# Patient Record
Sex: Male | Born: 1990 | Race: Black or African American | Hispanic: No | Marital: Single | Smoking: Never smoker
Health system: Southern US, Community
[De-identification: ages and names within clinical notes are randomized; demographics above are authoritative.]

## PROBLEM LIST (undated history)

## (undated) DIAGNOSIS — L813 Cafe au lait spots: Secondary | ICD-10-CM

## (undated) DIAGNOSIS — Q8789 Other specified congenital malformation syndromes, not elsewhere classified: Secondary | ICD-10-CM

## (undated) DIAGNOSIS — J45909 Unspecified asthma, uncomplicated: Secondary | ICD-10-CM

## (undated) HISTORY — PX: OTHER SURGICAL HISTORY: SHX169

---
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
## 2015-01-28 ENCOUNTER — Emergency Department
Admission: EM | Admit: 2015-01-28 | Discharge: 2015-01-28 | Disposition: A | Discharge status: Home / Self Care | Payer: Medicaid Other | Attending: Emergency Medicine | Admitting: Emergency Medicine

## 2015-01-28 ENCOUNTER — Encounter: Payer: Self-pay | Admitting: Emergency Medicine

## 2015-01-28 DIAGNOSIS — K047 Periapical abscess without sinus: Secondary | ICD-10-CM | POA: Insufficient documentation

## 2015-01-28 HISTORY — DX: Other specified congenital malformation syndromes, not elsewhere classified: Q87.89

## 2015-01-28 HISTORY — DX: Cafe au lait spots: L81.3

## 2015-01-28 MED ORDER — AMOXICILLIN 500 MG PO CAPS: 500.0000 mg | ORAL_CAPSULE | Freq: Three times a day (TID) | ORAL | Status: DC

## 2015-01-28 MED ORDER — IBUPROFEN 800 MG PO TABS: 800.0000 mg | ORAL_TABLET | Freq: Three times a day (TID) | ORAL | Status: DC

## 2015-01-28 NOTE — ED Notes (Signed)
Pt arrived by POV with noticeable swelling to left lower jaw/mouth that the morning of 01/27/2015 and has further progressed. Pt thinks it may be an abscess.

## 2015-01-28 NOTE — Discharge Instructions (Signed)
Dental Abscess A dental abscess is a collection of infected fluid (pus) from a bacterial infection in the inner part of the tooth (pulp). It usually occurs at the end of the tooth's root.  CAUSES   Severe tooth decay.  Trauma to the tooth that allows bacteria to enter into the pulp, such as a broken or chipped tooth. SYMPTOMS   Severe pain in and around the infected tooth.  Swelling and redness around the abscessed tooth or in the mouth or face.  Tenderness.  Pus drainage.  Bad breath.  Bitter taste in the mouth.  Difficulty swallowing.  Difficulty opening the mouth.  Nausea.  Vomiting.  Chills.  Swollen neck glands. DIAGNOSIS   A medical and dental history will be taken.  An examination will be performed by tapping on the abscessed tooth.  X-rays may be taken of the tooth to identify the abscess. TREATMENT The goal of treatment is to eliminate the infection. You may be prescribed antibiotic medicine to stop the infection from spreading. A root canal may be performed to save the tooth. If the tooth cannot be saved, it may be pulled (extracted) and the abscess may be drained.  HOME CARE INSTRUCTIONS  Only take over-the-counter or prescription medicines for pain, fever, or discomfort as directed by your caregiver.  Rinse your mouth (gargle) often with salt water ( tsp salt in 8 oz [250 ml] of warm water) to relieve pain or swelling.  Do not drive after taking pain medicine (narcotics).  Do not apply heat to the outside of your face.  Return to your dentist for further treatment as directed. SEEK MEDICAL CARE IF:  Your pain is not helped by medicine.  Your pain is getting worse instead of better. SEEK IMMEDIATE MEDICAL CARE IF:  You have a fever or persistent symptoms for more than 2-3 days.  You have a fever and your symptoms suddenly get worse.  You have chills or a very bad headache.  You have problems breathing or swallowing.  You have trouble  opening your mouth.  You have swelling in the neck or around the eye. Document Released: 07/28/2005 Document Revised: 04/21/2012 Document Reviewed: 11/05/2010 [facility] Patient Information 2015 [place], [place]. This information is not intended to replace advice given to you by your health care provider. Make sure you discuss any questions you have with your health care provider.   TAKE ALL OF THE MEDICATION PRESCRIBED TODAY CALL A DENTIST SUCH AS DR. SLOTT OR ONE THAT IS ACCEPTING MEDICAID PATIENTS

## 2015-01-28 NOTE — ED Provider Notes (Signed)
[facility] Emergency Department Provider Note  ____________________________________________  Time seen:  11:50 AM  I have reviewed the triage vital signs and the nursing notes.   HISTORY  Chief Complaint Dental Pain   HPI Scott Pearson is a 24 y.o. male here today with complaint of left lower jaw pain and swelling. He states he woke up this morning and noticed the swelling. He is not have a regular dentist and has not seen one in a long time. He states his mother gave him an aspirin for the pain. Currently he denies pain about 5. Eating has made this worse. Nothing is making it better   Past Medical History  Diagnosis Date  . Neurofibromatosis type 1-like syndrome     There are no active problems to display for this patient.   Past Surgical History  Procedure Laterality Date  . Neurofibromatosis removal      Current Outpatient Rx  Name  Route  Sig  Dispense  Refill  . amoxicillin (AMOXIL) 500 MG capsule   Oral   Take 1 capsule (500 mg total) by mouth 3 (three) times daily.   30 capsule   0   . ibuprofen (ADVIL,MOTRIN) 800 MG tablet   Oral   Take 1 tablet (800 mg total) by mouth 3 (three) times daily.   30 tablet   0     Allergies Peanuts  No family history on file.  Social History History  Substance Use Topics  . Smoking status: Never Smoker   . Smokeless tobacco: Not on file  . Alcohol Use: Yes    Review of Systems Constitutional: No fever/chills Eyes: No visual changes. ENT: No sore throat. Cardiovascular: Denies chest pain. Respiratory: Denies shortness of breath. Gastrointestinal: No abdominal pain.  No nausea, no vomiting. Musculoskeletal: Negative for back pain. Skin: Negative for rash. Neurological: Negative for headaches, focal weakness or numbness.  10-point ROS otherwise negative.  ____________________________________________   PHYSICAL EXAM:  VITAL SIGNS: ED Triage Vitals  Enc Vitals Group     BP  --      Pulse --      Resp --      Temp --      Temp src --      SpO2 --      Weight --      Height --      Head Cir --      Peak Flow --      Pain Score --      Pain Loc --      Pain Edu? --      Excl. in GC? --     Constitutional: Alert and oriented. Well appearing and in no acute distress. Eyes: Conjunctivae are normal. PERRL. EOMI. Head: Atraumatic. Nose: No congestion/rhinnorhea. Mouth/Throat: Mucous membranes are moist.  Oropharynx non-erythematous. Left lower gum midline lateral aspect with swelling. There is some mild facial edema in this area as well. Tenderness on palpation of the gum area. Neck: No stridor.  Supple Hematological/Lymphatic/Immunilogical: No cervical lymphadenopathy. Cardiovascular: Normal rate, regular rhythm. Grossly normal heart sounds.  Good peripheral circulation. Respiratory: Normal respiratory effort.  No retractions. Lungs CTAB. Gastrointestinal: Soft and nontender. No distention. Musculoskeletal: No lower extremity tenderness nor edema.  No joint effusions. Neurologic:  Normal speech and language. No gross focal neurologic deficits are appreciated. Speech is normal. No gait instability. Skin:  Skin is warm, dry and intact. No rash noted. As stated above Psychiatric: Mood and affect are normal. Speech  and behavior are normal.  ____________________________________________   LABS (all labs ordered are listed, but only abnormal results are displayed)  Labs Reviewed - No data to display   PROCEDURES  Procedure(s) performed: None  Critical Care performed: No  ____________________________________________   INITIAL IMPRESSION / ASSESSMENT AND PLAN / ED COURSE  Pertinent labs & imaging results that were available during my care of the patient were reviewed by me and considered in my medical decision making (see chart for details).  Patient was given a list of dental clinics that he may qualify for. He is also given a prescription for  amoxicillin and ibuprofen. ____________________________________________   FINAL CLINICAL IMPRESSION(S) / ED DIAGNOSES  Final diagnoses:  Dental abscess      Tommi Rumps, PA-C 01/28/15 [id number]  Minna Antis, MD 01/28/15 1506

## 2016-02-05 ENCOUNTER — Emergency Department: Payer: Self-pay

## 2016-02-05 ENCOUNTER — Emergency Department
Admission: EM | Admit: 2016-02-05 | Discharge: 2016-02-05 | Disposition: A | Discharge status: Home / Self Care | Payer: Self-pay | Attending: Emergency Medicine | Admitting: Emergency Medicine

## 2016-02-05 ENCOUNTER — Encounter: Payer: Self-pay | Admitting: Emergency Medicine

## 2016-02-05 DIAGNOSIS — M79671 Pain in right foot: Secondary | ICD-10-CM | POA: Insufficient documentation

## 2016-02-05 DIAGNOSIS — Z91010 Allergy to peanuts: Secondary | ICD-10-CM | POA: Insufficient documentation

## 2016-02-05 DIAGNOSIS — F1290 Cannabis use, unspecified, uncomplicated: Secondary | ICD-10-CM | POA: Insufficient documentation

## 2016-02-05 MED ORDER — NAPROXEN 500 MG PO TABS: 500.0000 mg | ORAL_TABLET | Freq: Two times a day (BID) | ORAL | Status: DC

## 2016-02-05 NOTE — Discharge Instructions (Signed)
Musculoskeletal Pain Musculoskeletal pain is muscle and boney aches and pains. These pains can occur in any part of the body. Your caregiver may treat you without knowing the cause of the pain. They may treat you if blood or urine tests, X-rays, and other tests were normal.  CAUSES There is often not a definite cause or reason for these pains. These pains may be caused by a type of germ (virus). The discomfort may also come from overuse. Overuse includes working out too hard when your body is not fit. Boney aches also come from weather changes. Bone is sensitive to atmospheric pressure changes. HOME CARE INSTRUCTIONS   Ask when your test results will be ready. Make sure you get your test results.  Only take over-the-counter or prescription medicines for pain, discomfort, or fever as directed by your caregiver. If you were given medications for your condition, do not drive, operate machinery or power tools, or sign legal documents for 24 hours. Do not drink alcohol. Do not take sleeping pills or other medications that may interfere with treatment.  Continue all activities unless the activities cause more pain. When the pain lessens, slowly resume normal activities. Gradually increase the intensity and duration of the activities or exercise.  During periods of severe pain, bed rest may be helpful. Lay or sit in any position that is comfortable.  Putting ice on the injured area.  Put ice in a bag.  Place a towel between your skin and the bag.  Leave the ice on for 15 to 20 minutes, 3 to 4 times a day.  Follow up with your caregiver for continued problems and no reason can be found for the pain. If the pain becomes worse or does not go away, it may be necessary to repeat tests or do additional testing. Your caregiver may need to look further for a possible cause. SEEK IMMEDIATE MEDICAL CARE IF:  You have pain that is getting worse and is not relieved by medications.  You develop chest pain  that is associated with shortness or breath, sweating, feeling sick to your stomach (nauseous), or throw up (vomit).  Your pain becomes localized to the abdomen.  You develop any new symptoms that seem different or that concern you. MAKE SURE YOU:   Understand these instructions.  Will watch your condition.  Will get help right away if you are not doing well or get worse.   This information is not intended to replace advice given to you by your health care provider. Make sure you discuss any questions you have with your health care provider.   Document Released: 07/28/2005 Document Revised: 10/20/2011 Document Reviewed: 04/01/2013 Elsevier Interactive Patient Education 2016 [place] therapy can help ease sore, stiff, injured, and tight muscles and joints. Heat relaxes your muscles, which may help ease your pain. Heat therapy should only be used on old, pre-existing, or long-lasting (chronic) injuries. Do not use heat therapy unless told by your doctor. HOW TO USE HEAT THERAPY There are several different kinds of heat therapy, including:  Moist heat pack.  Warm water bath.  Hot water bottle.  Electric heating pad.  Heated gel pack.  Heated wrap.  Electric heating pad. GENERAL HEAT THERAPY RECOMMENDATIONS   Do not sleep while using heat therapy. Only use heat therapy while you are awake.  Your skin may turn pink while using heat therapy. Do not use heat therapy if your skin turns red.  Do not use heat therapy if  you have new pain.  High heat or long exposure to heat can cause burns. Be careful when using heat therapy to avoid burning your skin.  Do not use heat therapy on areas of your skin that are already irritated, such as with a rash or sunburn. GET HELP IF:   You have blisters, redness, swelling (puffiness), or numbness.  You have new pain.  Your pain is worse. MAKE SURE YOU:  Understand these instructions.  Will watch your  condition.  Will get help right away if you are not doing well or get worse.   This information is not intended to replace advice given to you by your health care provider. Make sure you discuss any questions you have with your health care provider.   Document Released: 10/20/2011 Document Revised: 08/18/2014 Document Reviewed: 09/20/2013 Elsevier Interactive Patient Education [employer] Inc[employer]

## 2016-02-05 NOTE — ED Notes (Signed)
Developed pain with some swelling to right foot  Denies any injury but does a lot of standing

## 2016-02-05 NOTE — ED Provider Notes (Signed)
[facility] Emergency Department Provider Note  ____________________________________________  Time seen: Approximately 10:23 AM  I have reviewed the triage vital signs and the nursing notes.   HISTORY  Chief Complaint Foot Pain    HPI Scott Pearson is a 25 y.o. male who presents for evaluation of pain and swelling to his right foot. Denies loss of use. States he stands on his feet all day. Pain is increased when walking or standing. He reports his pain is 8/10.   Past Medical History  Diagnosis Date  . Neurofibromatosis type 1-like syndrome     There are no active problems to display for this patient.   Past Surgical History  Procedure Laterality Date  . Neurofibromatosis removal      Current Outpatient Rx  Name  Route  Sig  Dispense  Refill  . naproxen (NAPROSYN) 500 MG tablet   Oral   Take 1 tablet (500 mg total) by mouth 2 (two) times daily with a meal.   60 tablet   0     Allergies Peanuts  No family history on file.  Social History Social History  Substance Use Topics  . Smoking status: Never Smoker   . Smokeless tobacco: None  . Alcohol Use: Yes    Review of Systems Constitutional: No fever/chills Cardiovascular: Denies chest pain. Respiratory: Denies shortness of breath. Genitourinary: Negative for dysuria. Musculoskeletal: Positive for right foot pain Skin: Negative for rash. Neurological: Negative for headaches, focal weakness or numbness.  10-point ROS otherwise negative.  ____________________________________________   PHYSICAL EXAM:  VITAL SIGNS: ED Triage Vitals  Enc Vitals Group     BP 02/05/16 1021 116/63 mmHg     Pulse Rate 02/05/16 1021 72     Resp 02/05/16 1021 16     Temp 02/05/16 1021 98.2 F (36.8 C)     Temp Source 02/05/16 1021 Oral     SpO2 02/05/16 1021 99 %     Weight 02/05/16 1021 160 lb (72.576 kg)     Height 02/05/16 1021 5\' 10"  (1.778 m)     Head Cir --      Peak Flow --    Pain Score --      Pain Loc --      Pain Edu? --      Excl. in GC? --     Constitutional: Alert and oriented. Well appearing and in no acute distress. Cardiovascular: Normal rate, regular rhythm. Grossly normal heart sounds.  Good peripheral circulation. Respiratory: Normal respiratory effort.  No retractions. Lungs CTAB. Musculoskeletal: No lower extremity tenderness nor edema.  No joint effusions. Neurologic:  Normal speech and language. No gross focal neurologic deficits are appreciated. No gait instability. Skin:  Skin is warm, dry and intact. No rash noted. Psychiatric: Mood and affect are normal. Speech and behavior are normal.  ____________________________________________   LABS (all labs ordered are listed, but only abnormal results are displayed)  Labs Reviewed - No data to display ____________________________________________  RADIOLOGY  No acute osseous findings. ____________________________________________   PROCEDURES  Procedure(s) performed: None  Critical Care performed: No  ____________________________________________   INITIAL IMPRESSION / ASSESSMENT AND PLAN / ED COURSE  Pertinent labs & imaging results that were available during my care of the patient were reviewed by me and considered in my medical decision making (see chart for details).  Nonspecific right foot pain and swelling. Rx given for ibuprofen 800 mg 3 times a day. Encourage use of inserts for his shoes. Work excuse  24 hours given. Patient follow-up PCP or return to ER with any worsening symptomology. Referral given to podiatry Dr. Ether GriffinsFowler. ____________________________________________   FINAL CLINICAL IMPRESSION(S) / ED DIAGNOSES  Final diagnoses:  Foot pain, right     This chart was dictated using voice recognition software/Dragon. Despite best efforts to proofread, errors can occur which can change the meaning. Any change was purely unintentional.   Evangeline Dakinharles M Nahdia Doucet, PA-C 02/05/16  1126  Emily FilbertJonathan E Williams, MD 02/05/16 [id number]

## 2017-10-29 ENCOUNTER — Encounter: Payer: Self-pay | Admitting: Emergency Medicine

## 2017-10-29 ENCOUNTER — Emergency Department
Admission: EM | Admit: 2017-10-29 | Discharge: 2017-10-29 | Disposition: A | Discharge status: Home / Self Care | Payer: Self-pay | Attending: Emergency Medicine | Admitting: Emergency Medicine

## 2017-10-29 ENCOUNTER — Other Ambulatory Visit: Payer: Self-pay

## 2017-10-29 DIAGNOSIS — T782XXA Anaphylactic shock, unspecified, initial encounter: Secondary | ICD-10-CM | POA: Insufficient documentation

## 2017-10-29 DIAGNOSIS — Z91010 Allergy to peanuts: Secondary | ICD-10-CM | POA: Insufficient documentation

## 2017-10-29 DIAGNOSIS — T783XXA Angioneurotic edema, initial encounter: Secondary | ICD-10-CM | POA: Insufficient documentation

## 2017-10-29 MED ORDER — PREDNISONE 10 MG PO TABS: ORAL_TABLET | ORAL | 0 refills | Status: DC

## 2017-10-29 MED ORDER — PREDNISONE 20 MG PO TABS
40.0000 mg | ORAL_TABLET | Freq: Once | ORAL | Status: AC
  Administered 2017-10-29: 40 mg via ORAL
  Filled 2017-10-29: qty 2

## 2017-10-29 MED ORDER — EPINEPHRINE 0.3 MG/0.3ML IJ SOAJ: 0.3000 mg | Freq: Once | INTRAMUSCULAR | 2 refills | Status: AC

## 2017-10-29 MED ORDER — EPINEPHRINE 0.3 MG/0.3ML IJ SOAJ
0.3000 mg | Freq: Once | INTRAMUSCULAR | Status: AC
  Administered 2017-10-29: 0.3 mg via INTRAMUSCULAR

## 2017-10-29 MED ORDER — EPINEPHRINE 0.3 MG/0.3ML IJ SOAJ
INTRAMUSCULAR | Status: AC
  Administered 2017-10-29: 0.3 mg via INTRAMUSCULAR
  Filled 2017-10-29: qty 0.3

## 2017-10-29 NOTE — ED Notes (Signed)
Nurse introduced herself to pt. Pt stating swelling has gone down after Epi-pen. Pt in NAD. Left side of tongue remains with mild edema.

## 2017-10-29 NOTE — ED Notes (Signed)
Pt reports waking up with swelling to left half of tongue, denies taking any new medications or coming in contact with any new substance.  Pt does report allergy to peanuts, eating cashews last night, reports having cashews in past without any reaction.  Pt able to speak in full, clear sentences, no respiratory distress noted at this time.

## 2017-10-29 NOTE — ED Triage Notes (Signed)
Pt took Benadryl about 30 min ago, states he can't tell if it's working yet.

## 2017-10-29 NOTE — ED Triage Notes (Signed)
Pt woke up with swollen tongue this am, denies known trigger, denies shob, appears in no distress at this time.

## 2017-10-29 NOTE — Discharge Instructions (Signed)
You are evaluated for swelling of the tongue and also a vomiting episode, as we discussed, it is unclear whether not this could have been a severe allergic reaction called anaphylaxis for which she were go ahead and treated with the epinephrine autoinjector here and improved some.  Is also possible this could be what is called angioedema or hereditary angioedema.  In any case you do need to fill an epi autoinjector so that you have in case you have any allergic reaction including trouble swallowing or trouble breathing or known allergic reaction with nausea vomiting dizziness or passing out.  You also need to follow-up with a doctor for allergy testing and I have given the office number for ENT, as well as a primary care doctor.  Return to emergency department immediately for any worsening swelling, certainly any trouble swallowing or trouble breathing, passing out, or any other symptoms concerning to you.  You may take over-the-counter Benadryl 25 mg every 4 hours as needed for any additional ongoing swelling.

## 2017-10-29 NOTE — ED Provider Notes (Signed)
[facility] Emergency Department Provider Note ____________________________________________   I have reviewed the triage vital signs and the triage nursing note.  HISTORY  Chief Complaint No chief complaint on file. Tongue swelling  Historian Patient  HPI NIV Scott Pearson is a 27 y.o. male with a history of peanut allergy although is not carry an EpiPen, presented to the ER due to tongue swelling.  States he went to bed around 4 AM this morning and then woke up just prior to arrival with left side of his tongue swollen.  No trouble breathing or trouble swallowing.  He did have an episode of throwing up this morning.  He is not having ongoing nausea.  No diarrhea.  No dizziness passing out.  No skin itching or rash.  No known peanut exposure, although it sounds like there was peanut butter or something in the household from somebody else's use.  He did have cashews, but he has no history of a tree nut allergy.  Symptoms moderate.  No known family history of angioedema.  Not on any medications.   Past Medical History:  Diagnosis Date  . Neurofibromatosis type 1-like syndrome     There are no active problems to display for this patient.   Past Surgical History:  Procedure Laterality Date  . neurofibromatosis removal      Prior to Admission medications   Medication Sig Start Date End Date Taking? Authorizing Provider  EPINEPHrine (EPIPEN 2-PAK) 0.3 mg/0.3 mL IJ SOAJ injection Inject 0.3 mLs (0.3 mg total) into the muscle once for 1 dose. 10/29/17 10/29/17  Governor Rooks, MD  naproxen (NAPROSYN) 500 MG tablet Take 1 tablet (500 mg total) by mouth 2 (two) times daily with a meal. Patient not taking: Reported on 10/29/2017 02/05/16   Beers, Charmayne Sheer, PA-C  predniSONE (DELTASONE) 10 MG tablet 40mg  daily for 2 days 30mg  daily for 2 days 20mg  daily for 2 days 10 mg daily for 2 days 10/29/17   Governor Rooks, MD    Allergies  Allergen Reactions  . Peanuts  [Peanut Oil] Anaphylaxis    No family history on file.  Social History Social History   Tobacco Use  . Smoking status: Never Smoker  . Smokeless tobacco: Never Used  Substance Use Topics  . Alcohol use: Yes  . Drug use: Yes    Types: Marijuana    Review of Systems  Constitutional: Negative for fever. Eyes: Negative for visual changes. ENT: Negative for sore throat.  Left-sided tongue swelling as per HPI. Cardiovascular: Negative for chest pain. Respiratory: Negative for shortness of breath. Gastrointestinal: Negative for abdominal pain, diarrhea. Genitourinary: Negative for dysuria. Musculoskeletal: Negative for back pain. Skin: Negative for rash. Neurological: Negative for headache.  ____________________________________________   PHYSICAL EXAM:  VITAL SIGNS: ED Triage Vitals [10/29/17 0914]  Enc Vitals Group     BP 118/72     Pulse Rate 72     Resp 18     Temp 97.7 F (36.5 C)     Temp Source Oral     SpO2 100 %     Weight 150 lb (68 kg)     Height 5\' 10"  (1.778 m)     Head Circumference      Peak Flow      Pain Score 0     Pain Loc      Pain Edu?      Excl. in GC?      Constitutional: Alert and oriented. Well appearing and in no  distress. HEENT   Head: Normocephalic and atraumatic.      Eyes: Conjunctivae are normal. Pupils equal and round.       Ears:         Nose: No congestion/rhinnorhea.   Mouth/Throat: Mucous membranes are moist.  Left side of his tongue is significant swelling.  Lips are normal.  No posterior pharyngeal erythema.  No trouble speaking or hoarse voice.   Neck: No stridor. Cardiovascular/Chest: Normal rate, regular rhythm.  No murmurs, rubs, or gallops. Respiratory: Normal respiratory effort without tachypnea nor retractions. Breath sounds are clear and equal bilaterally. No wheezes/rales/rhonchi. Gastrointestinal: Soft. No distention, no guarding, no rebound. Nontender.     Genitourinary/rectal:Deferred Musculoskeletal: Nontender with normal range of motion in all extremities. No joint effusions.  No lower extremity tenderness.  No edema. Neurologic:  Normal speech and language. No gross or focal neurologic deficits are appreciated. Skin:  Skin is warm, dry and intact. No rash noted. Psychiatric: Mood and affect are normal. Speech and behavior are normal. Patient exhibits appropriate insight and judgment.   ____________________________________________  LABS (pertinent positives/negatives) I, Governor Rooksebecca Scout Guyett, MD the attending physician have reviewed the labs noted below.  Labs Reviewed - No data to display  __________________________________________  PROCEDURES  Procedure(s) performed: None  Procedures  Critical Care performed: None   ____________________________________________  ED COURSE / ASSESSMENT AND PLAN  Pertinent labs & imaging results that were available during my care of the patient were reviewed by me and considered in my medical decision making (see chart for details).  She does have angioedema to the left side of his tongue but is not having any acute airway or swallowing issues.  He is not on any medications that should be causing this.  Of some concern is the fact that he does have at least one anaphylactic allergy but is not on EpiPen.  No known exposure.  Given the fact that he had vomiting this morning out of nowhere, I question the fact of whether or not this could have been anaphylaxis with swelling as well as vomiting and discussed with him to go ahead and treat with a dose of epi.  Patient was taught at the bedside how to use the EpiPen autoinjector.  He was also given Benadryl earlier.  After 4 hours observation tongue is almost back to normal.  We discussed the possibility of whether or not this could have been a isolated angioedema or hereditary angioedema versus anaphylaxis to either contamination with peanuts which he  knows he is allergic to whether not there is a new allergy.  We discussed the questionable benefit to steroid course but chose to proceed.  He is going to do over-the-counter Benadryl.  I have prescribed him epi autoinjector and discussed how to use it.  We discussed return precautions.      Patient / Family / Caregiver informed of clinical course, medical decision-making process, and agree with plan.   I discussed return precautions, follow-up instructions, and discharge instructions with patient and/or family.  Discharge Instructions : You are evaluated for swelling of the tongue and also a vomiting episode, as we discussed, it is unclear whether not this could have been a severe allergic reaction called anaphylaxis for which she were go ahead and treated with the epinephrine autoinjector here and improved some.  Is also possible this could be what is called angioedema or hereditary angioedema.  In any case you do need to fill an epi autoinjector so that you have in case you  have any allergic reaction including trouble swallowing or trouble breathing or known allergic reaction with nausea vomiting dizziness or passing out.  You also need to follow-up with a doctor for allergy testing and I have given the office number for ENT, as well as a primary care doctor.  Return to emergency department immediately for any worsening swelling, certainly any trouble swallowing or trouble breathing, passing out, or any other symptoms concerning to you.  You may take over-the-counter Benadryl 25 mg every 4 hours as needed for any additional ongoing swelling.    ___________________________________________   FINAL CLINICAL IMPRESSION(S) / ED DIAGNOSES   Final diagnoses:  Angioedema, initial encounter  Anaphylaxis, initial encounter      ___________________________________________        Note: This dictation was prepared with Dragon dictation. Any transcriptional errors that result  from this process are unintentional    Governor Rooks, MD 10/29/17 1348

## 2017-10-29 NOTE — ED Notes (Addendum)
First Nurse Note:  Patient has swelling to left side of tongue that started at 0400 this AM.  Vomited this AM also.  Speech is clear, speaking in full sentences.  Denies SHOB.  Wearing face mask.

## 2019-08-20 ENCOUNTER — Emergency Department: Payer: HRSA Program

## 2019-08-20 ENCOUNTER — Other Ambulatory Visit: Payer: Self-pay

## 2019-08-20 ENCOUNTER — Emergency Department
Admission: EM | Admit: 2019-08-20 | Discharge: 2019-08-20 | Disposition: A | Discharge status: Home / Self Care | Payer: HRSA Program | Attending: Student | Admitting: Student

## 2019-08-20 DIAGNOSIS — Z20822 Contact with and (suspected) exposure to covid-19: Secondary | ICD-10-CM

## 2019-08-20 DIAGNOSIS — J45909 Unspecified asthma, uncomplicated: Secondary | ICD-10-CM | POA: Diagnosis not present

## 2019-08-20 DIAGNOSIS — R0602 Shortness of breath: Secondary | ICD-10-CM

## 2019-08-20 DIAGNOSIS — U071 COVID-19: Secondary | ICD-10-CM | POA: Insufficient documentation

## 2019-08-20 HISTORY — DX: Unspecified asthma, uncomplicated: J45.909

## 2019-08-20 MED ORDER — ALBUTEROL SULFATE HFA 108 (90 BASE) MCG/ACT IN AERS: 2.0000 | INHALATION_SPRAY | Freq: Four times a day (QID) | RESPIRATORY_TRACT | 0 refills | Status: DC | PRN

## 2019-08-20 MED ORDER — PREDNISONE 10 MG PO TABS: 10.0000 mg | ORAL_TABLET | Freq: Every day | ORAL | 0 refills | Status: DC

## 2019-08-20 NOTE — Discharge Instructions (Signed)
Your Covid results can take 18 to 24 hours.  They will call you if your test are positive.  Please quarantine until your test have resulted.  Return to the ER for any worsening shortness of breath, chest pain, worsening symptoms or urgent changes in health.

## 2019-08-20 NOTE — ED Notes (Signed)
Per pt kids tested positive for COVID "a couple days ago" pt couldn't remember how many days exactly

## 2019-08-20 NOTE — ED Provider Notes (Signed)
[facility] EMERGENCY DEPARTMENT Provider Note   CSN: [id number] Arrival date & time: 08/20/19  1928     History Chief Complaint  Patient presents with  . Shortness of Breath    Scott Pearson is a 29 y.o. male presents to the emergency department for evaluation of shortness of breath.  Shortness of breath occurred an hour ago at the grocery store.  He describes hard time catching his breath.  States he has been exposed to Covid and feels as if he has Covid due to both of his sons being positive.  He denies being tested for Covid.  He has had no cough congestion runny nose, wheezing, chest pain, palpitations, dizziness, rashes, abdominal pain nausea vomiting or diarrhea.  His shortness of breath is not present at this time.  He denies any history of blood clots, DVTs or clotting disorders.  HPI     Past Medical History:  Diagnosis Date  . Asthma   . Neurofibromatosis type 1-like syndrome     There are no problems to display for this patient.   Past Surgical History:  Procedure Laterality Date  . neurofibromatosis removal         No family history on file.  Social History   Tobacco Use  . Smoking status: Never Smoker  . Smokeless tobacco: Never Used  Substance Use Topics  . Alcohol use: Yes    Comment: occasion  . Drug use: Yes    Types: Marijuana    Home Medications Prior to Admission medications   Medication Sig Start Date End Date Taking? Authorizing Provider  albuterol (VENTOLIN HFA) 108 (90 Base) MCG/ACT inhaler Inhale 2 puffs into the lungs every 6 (six) hours as needed for wheezing or shortness of breath. 08/20/19   Duanne Guess, PA-C  naproxen (NAPROSYN) 500 MG tablet Take 1 tablet (500 mg total) by mouth 2 (two) times daily with a meal. Patient not taking: Reported on 10/29/2017 02/05/16   Beers, Pierce Crane, PA-C  predniSONE (DELTASONE) 10 MG tablet Take 1 tablet (10 mg total) by mouth daily. 6,5,4,3,2,1 six day taper 08/20/19    Duanne Guess, PA-C    Allergies    Peanuts [peanut oil]  Review of Systems   Review of Systems  Constitutional: Negative for chills and fever.  Respiratory: Positive for shortness of breath. Negative for cough.   Cardiovascular: Negative for chest pain and leg swelling.  Gastrointestinal: Negative for diarrhea, nausea and vomiting.  Musculoskeletal: Negative for myalgias and neck pain.  Neurological: Negative for dizziness, light-headedness and headaches.    Physical Exam Updated Vital Signs BP 133/65 (BP Location: Left Arm)   Pulse 79   Temp 98.4 F (36.9 C) (Oral)   Resp 18   Ht 5\' 9"  (1.753 m)   Wt 74.8 kg   SpO2 99%   BMI 24.37 kg/m   Physical Exam Constitutional:      Appearance: He is well-developed.  HENT:     Head: Normocephalic and atraumatic.  Eyes:     Conjunctiva/sclera: Conjunctivae normal.  Cardiovascular:     Rate and Rhythm: Normal rate.  Pulmonary:     Effort: Pulmonary effort is normal. No tachypnea, accessory muscle usage or respiratory distress.     Breath sounds: Normal breath sounds. No stridor. No decreased breath sounds, wheezing, rhonchi or rales.  Chest:     Chest wall: No tenderness.  Musculoskeletal:        General: Normal range of motion.  Cervical back: Normal range of motion.     Comments: No swelling or edema in the lower extremities.  Skin:    General: Skin is warm.     Findings: No rash.  Neurological:     General: No focal deficit present.     Mental Status: He is alert. He is disoriented.  Psychiatric:        Behavior: Behavior normal.        Thought Content: Thought content normal.     ED Results / Procedures / Treatments   Labs (all labs ordered are listed, but only abnormal results are displayed) Labs Reviewed  SARS CORONAVIRUS 2 (TAT 6-24 HRS)    EKG None  Radiology DG Chest Portable 1 View  Result Date: 08/20/2019 CLINICAL DATA:  COVID shortness of breath EXAM: PORTABLE CHEST 1 VIEW COMPARISON:   None. FINDINGS: The heart size and mediastinal contours are within normal limits. Both lungs are clear. The visualized skeletal structures are unremarkable. IMPRESSION: No active disease. Electronically Signed   By: Jonna Clark M.D.   On: 08/20/2019 21:34    Procedures Procedures (including critical care time)  Medications Ordered in ED Medications - No data to display  ED Course  I have reviewed the triage vital signs and the nursing notes.  Pertinent labs & imaging results that were available during my care of the patient were reviewed by me and considered in my medical decision making (see chart for details).    MDM Rules/Calculators/A&P                      29 year old male with shortness of breath earlier today.  Shortness of breath has resolved.  No chest pain.  No history of blood clots, PE.  No clotting disorders.  Vital signs are stable with normal O2 sats.  Heart rate normal.  Chest x-ray negative.  He has been exposed to Covid, both of his children are positive.  I have put in for a Covid test and he understands signs and symptoms return to ED for.  He is given prednisone and albuterol. Final Clinical Impression(s) / ED Diagnoses Final diagnoses:  SOB (shortness of breath)  Close exposure to COVID-19 virus    Rx / DC Orders ED Discharge Orders         Ordered    predniSONE (DELTASONE) 10 MG tablet  Daily     08/20/19 2209    albuterol (VENTOLIN HFA) 108 (90 Base) MCG/ACT inhaler  Every 6 hours PRN     08/20/19 2209           Ronnette Juniper 08/20/19 2210    Miguel Aschoff., MD 08/20/19 2303

## 2019-08-20 NOTE — ED Notes (Signed)
Pt states his kids tested positive for covid by [facility] possibly on Monday. Pt in nad but c/o sob. 99% on ra.

## 2019-08-20 NOTE — ED Triage Notes (Signed)
Pt to the er for SOB x 1 hour. No other symptoms.

## 2019-08-20 NOTE — ED Notes (Signed)
E-signature not obtained due to computer in room not functional.

## 2019-08-21 LAB — SARS CORONAVIRUS 2 (TAT 6-24 HRS): SARS Coronavirus 2: POSITIVE — AB

## 2019-08-22 ENCOUNTER — Telehealth: Payer: Self-pay

## 2019-08-22 ENCOUNTER — Telehealth: Payer: Self-pay | Admitting: Emergency Medicine

## 2019-08-22 NOTE — Telephone Encounter (Signed)
Attempted to notify SARS Coronavirus 2 test results x 3 phone busy each time

## 2019-08-22 NOTE — Telephone Encounter (Signed)
Called to inform patient that covid test was positive.  Number listed did not work.

## 2020-06-09 IMAGING — DX DG CHEST 1V PORT
1 series · 1 of 1 positions shown · non-contrast
Comparison: None.

CLINICAL DATA: COVID shortness of breath

EXAM:
PORTABLE CHEST 1 VIEW

[chest ap]
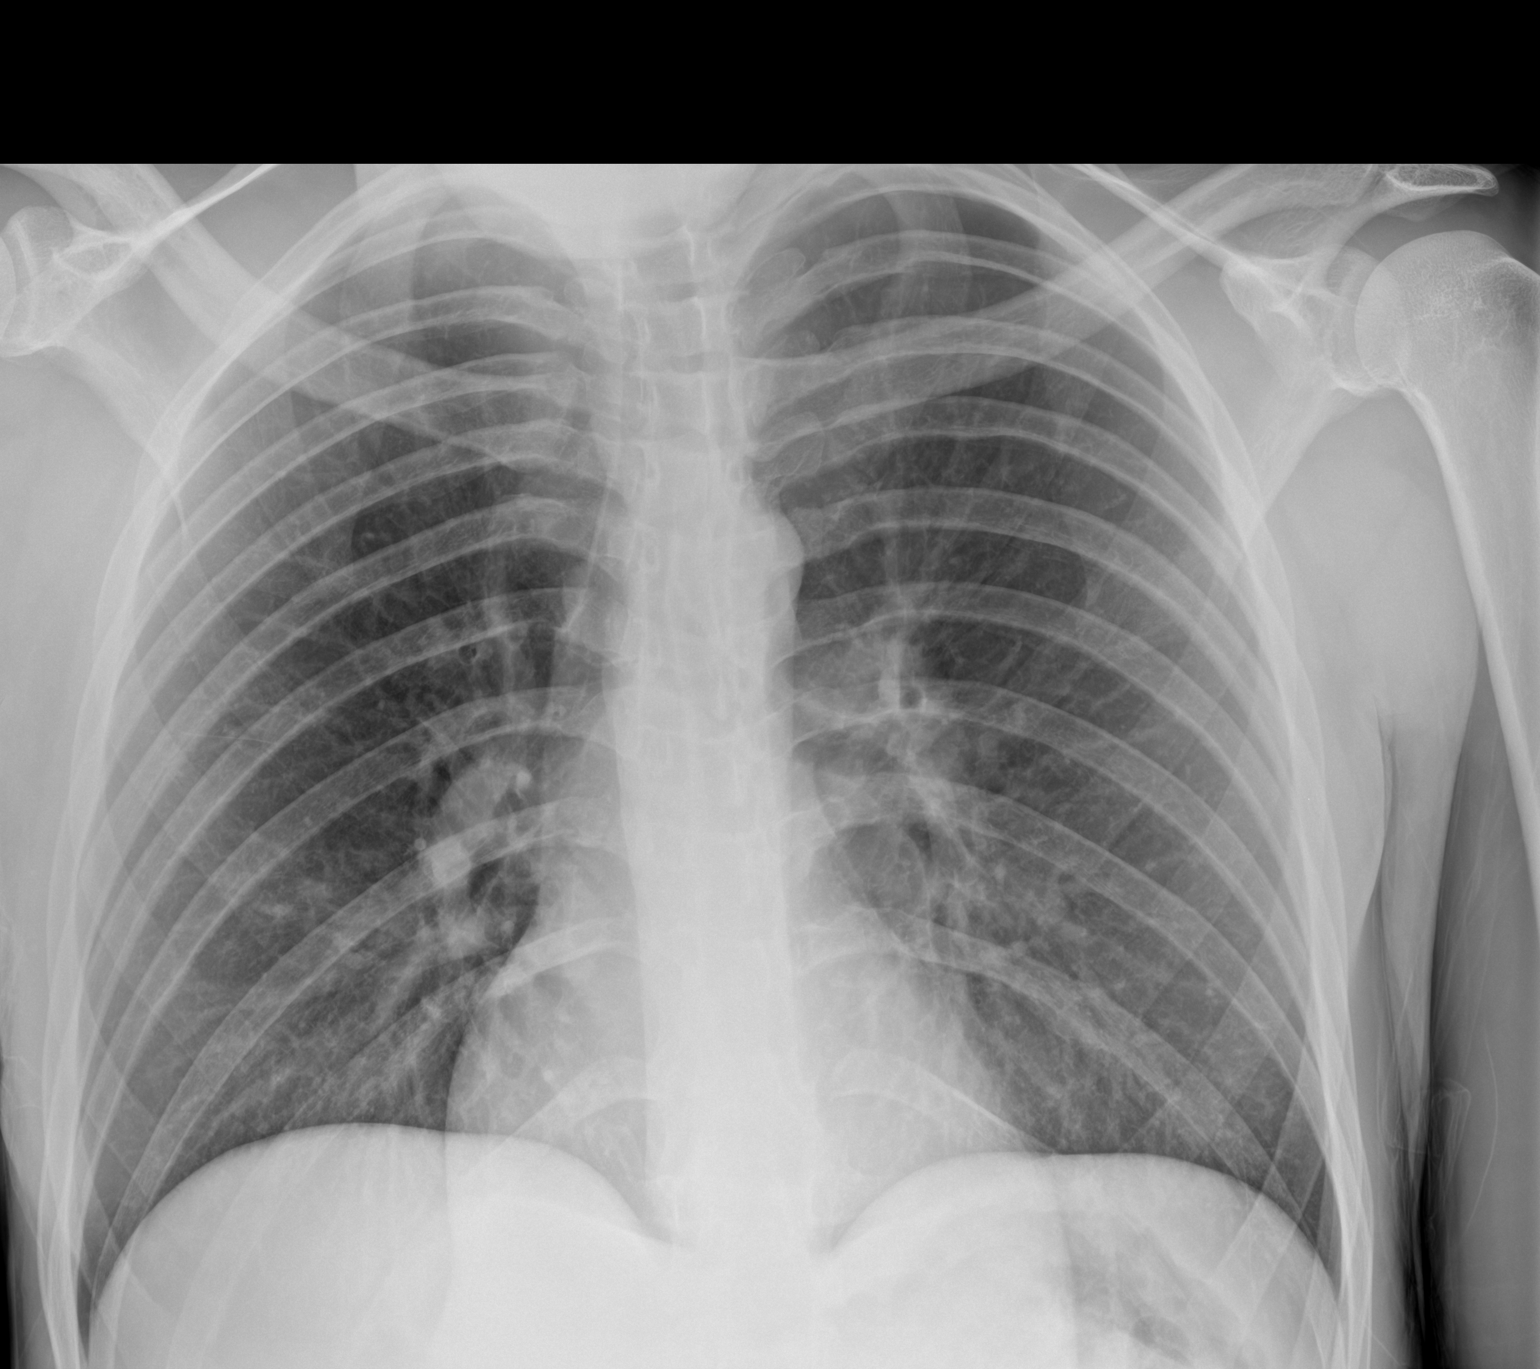

[1 of 1 positions shown; findings below may reference images not displayed]

FINDINGS: The heart size and mediastinal contours are within normal limits.
Both lungs are clear. The visualized skeletal structures are
unremarkable.
IMPRESSION: No active disease.

## 2023-02-26 ENCOUNTER — Emergency Department
Admission: EM | Admit: 2023-02-26 | Discharge: 2023-02-26 | Disposition: A | Discharge status: Home / Self Care | Payer: Self-pay | Attending: Emergency Medicine | Admitting: Emergency Medicine

## 2023-02-26 ENCOUNTER — Encounter: Payer: Self-pay | Admitting: Emergency Medicine

## 2023-02-26 ENCOUNTER — Other Ambulatory Visit: Payer: Self-pay

## 2023-02-26 DIAGNOSIS — J36 Peritonsillar abscess: Secondary | ICD-10-CM | POA: Insufficient documentation

## 2023-02-26 DIAGNOSIS — Z20822 Contact with and (suspected) exposure to covid-19: Secondary | ICD-10-CM | POA: Insufficient documentation

## 2023-02-26 LAB — CBC WITH DIFFERENTIAL/PLATELET
Abs Immature Granulocytes: 0.02 10*3/uL (ref 0.00–0.07)
Basophils Absolute: 0 10*3/uL (ref 0.0–0.1)
Basophils Relative: 1 %
Eosinophils Absolute: 0.2 10*3/uL (ref 0.0–0.5)
Eosinophils Relative: 3 %
HCT: 40.1 % (ref 39.0–52.0)
Hemoglobin: 13.6 g/dL (ref 13.0–17.0)
Immature Granulocytes: 0 %
Lymphocytes Relative: 16 %
Lymphs Abs: 1.1 10*3/uL (ref 0.7–4.0)
MCH: 31.6 pg (ref 26.0–34.0)
MCHC: 33.9 g/dL (ref 30.0–36.0)
MCV: 93 fL (ref 80.0–100.0)
Monocytes Absolute: 0.6 10*3/uL (ref 0.1–1.0)
Monocytes Relative: 9 %
Neutro Abs: 4.6 10*3/uL (ref 1.7–7.7)
Neutrophils Relative %: 71 %
Platelets: 179 10*3/uL (ref 150–400)
RBC: 4.31 MIL/uL (ref 4.22–5.81)
RDW: 11.2 % — ABNORMAL LOW (ref 11.5–15.5)
WBC: 6.5 10*3/uL (ref 4.0–10.5)
nRBC: 0 % (ref 0.0–0.2)

## 2023-02-26 LAB — BASIC METABOLIC PANEL
Anion gap: 7 (ref 5–15)
BUN: 9 mg/dL (ref 6–20)
CO2: 25 mmol/L (ref 22–32)
Calcium: 8.7 mg/dL — ABNORMAL LOW (ref 8.9–10.3)
Chloride: 106 mmol/L (ref 98–111)
Creatinine, Ser: 0.73 mg/dL (ref 0.61–1.24)
GFR, Estimated: >60 mL/min (ref >60)
Glucose, Bld: 96 mg/dL (ref 70–99)
Potassium: 3.6 mmol/L (ref 3.5–5.1)
Sodium: 138 mmol/L (ref 135–145)

## 2023-02-26 LAB — GROUP A STREP BY PCR: Group A Strep by PCR: NOT DETECTED

## 2023-02-26 LAB — SARS CORONAVIRUS 2 BY RT PCR: SARS Coronavirus 2 by RT PCR: NEGATIVE

## 2023-02-26 MED ORDER — AMOXICILLIN-POT CLAVULANATE 875-125 MG PO TABS: 1.0000 | ORAL_TABLET | Freq: Two times a day (BID) | ORAL | 0 refills | Status: AC

## 2023-02-26 MED ORDER — DEXAMETHASONE SODIUM PHOSPHATE 10 MG/ML IJ SOLN
10.0000 mg | Freq: Once | INTRAMUSCULAR | Status: AC
  Administered 2023-02-26: 10 mg via INTRAVENOUS
  Filled 2023-02-26: qty 1

## 2023-02-26 MED ORDER — PREDNISONE 10 MG PO TABS: ORAL_TABLET | ORAL | 0 refills | Status: AC

## 2023-02-26 MED ORDER — SODIUM CHLORIDE 0.9 % IV SOLN
3.0000 g | Freq: Once | INTRAVENOUS | Status: AC
  Administered 2023-02-26: 3 g via INTRAVENOUS
  Filled 2023-02-26: qty 8

## 2023-02-26 NOTE — ED Provider Notes (Signed)
[facility] Provider Note    None    (approximate)   History   Sore Throat   HPI  Scott Pearson is a 32 y.o. male   presents to the ED with complaint of sore throat that began this morning.  Patient states he noticed that with swallowing there was increased pain but has been able to drink fluids and maintain saliva.  He is unaware of any fever and denies chills.  No known sick contacts.  Patient has history of asthma and neurofibromatosis.      Physical Exam   Triage Vital Signs: ED Triage Vitals  Encounter Vitals Group     BP 02/26/23 0655 (!) 143/60     Systolic BP Percentile --      Diastolic BP Percentile --      Pulse Rate 02/26/23 0655 74     Resp 02/26/23 0655 18     Temp 02/26/23 0655 97.9 F (36.6 C)     Temp Source 02/26/23 0655 Oral     SpO2 02/26/23 0655 100 %     Weight 02/26/23 0654 150 lb (68 kg)     Height 02/26/23 0654 5\' 9"  (1.753 m)     Head Circumference --      Peak Flow --      Pain Score 02/26/23 0654 7     Pain Loc --      Pain Education --      Exclude from Growth Chart --     Most recent vital signs: Vitals:   02/26/23 0655 02/26/23 0936  BP: (!) 143/60 (!) 140/67  Pulse: 74 70  Resp: 18 16  Temp: 97.9 F (36.6 C)   SpO2: 100% 100%     General: Awake, no distress.  CV:  Good peripheral perfusion.  Resp:  Normal effort.  Abd:  No distention.  Other:  Posterior pharynx with mild erythema mostly localized to the right.  Uvula continues to be midline.  No exudate noted.  Neck is supple with minimal cervical lymphadenopathy on the right greater than left.   ED Results / Procedures / Treatments   Labs (all labs ordered are listed, but only abnormal results are displayed) Labs Reviewed  CBC WITH DIFFERENTIAL/PLATELET - Abnormal; Notable for the following components:      Result Value   RDW 11.2 (*)    All other components within normal limits  BASIC METABOLIC PANEL - Abnormal; Notable for the  following components:   Calcium 8.7 (*)    All other components within normal limits  GROUP A STREP BY PCR  SARS CORONAVIRUS 2 BY RT PCR      PROCEDURES:  Critical Care performed:   Procedures   MEDICATIONS ORDERED IN ED: Medications  Ampicillin-Sulbactam (UNASYN) 3 g in sodium chloride 0.9 % 100 mL IVPB (0 g Intravenous Stopped 02/26/23 0936)  dexamethasone (DECADRON) injection 10 mg (10 mg Intravenous Given 02/26/23 0840)     IMPRESSION / MDM / ASSESSMENT AND PLAN / ED COURSE  I reviewed the triage vital signs and the nursing notes.   Differential diagnosis includes, but is not limited to, peritonsillar abscess, strep, peritonsillar cellulitis, viral pharyngitis, tonsillitis, COVID.  32 year old male presents to the ED with complaint of sore throat that began this morning.  Patient has continued to drink fluids but states that eating increases pain in his throat.  COVID and strep test were negative.  Physical exam is more consistent with a peritonsillar cellulitis at this  time.  Patient was made aware.  IV antibiotics and steroids were given while in the emergency department.  A prescription for Augmentin and continued prednisone taper was sent to the pharmacy.  Patient is aware that he needs to call make an appointment with [facility] if any continued problems and return to the emergency department if any severe worsening of his symptoms.      Patient's presentation is most consistent with acute illness / injury with system symptoms.  FINAL CLINICAL IMPRESSION(S) / ED DIAGNOSES   Final diagnoses:  Peritonsillar cellulitis     Rx / DC Orders   ED Discharge Orders          Ordered    amoxicillin-clavulanate (AUGMENTIN) 875-125 MG tablet  2 times daily        02/26/23 0923    predniSONE (DELTASONE) 10 MG tablet        02/26/23 1610             Note:  This document was prepared using Dragon voice recognition software and may include unintentional dictation  errors.   Tommi Rumps, PA-C 02/26/23 1412    Sharman Cheek, MD 02/27/23 [phone]

## 2023-02-26 NOTE — Discharge Instructions (Addendum)
Call make an appoint with Dr. Willeen Cass who is on-call for [facility]. Begin taking the antibiotics and steroids today that were sent to the pharmacy. Tylenol or ibuprofen if needed for pain.  Drink lots of fluids to stay hydrated.  Return to the emergency department if any severe worsening of your symptoms.

## 2023-02-26 NOTE — ED Triage Notes (Signed)
Pt presents ambulatory to triage via POV with complaints of sore throat x 1 day. He notes he woke up with a burning throat. Rates the pain 7/10. A&Ox4 at this time. Denies fever, CP or SOB.

## 2023-02-26 NOTE — ED Notes (Signed)
See triage note  Presents with sore throat   States pain started 1 day ago  Unsure of fever  describe pain as burning

## 2023-08-23 ENCOUNTER — Emergency Department
Admission: EM | Admit: 2023-08-23 | Discharge: 2023-08-23 | Disposition: A | Discharge status: Home / Self Care | Payer: Self-pay | Attending: Emergency Medicine | Admitting: Emergency Medicine

## 2023-08-23 ENCOUNTER — Emergency Department: Payer: Self-pay

## 2023-08-23 ENCOUNTER — Other Ambulatory Visit: Payer: Self-pay

## 2023-08-23 DIAGNOSIS — R09A2 Foreign body sensation, throat: Secondary | ICD-10-CM | POA: Insufficient documentation

## 2023-08-23 LAB — CBC WITH DIFFERENTIAL/PLATELET
Abs Immature Granulocytes: 0.02 10*3/uL (ref 0.00–0.07)
Basophils Absolute: 0 10*3/uL (ref 0.0–0.1)
Basophils Relative: 1 %
Eosinophils Absolute: 0.3 10*3/uL (ref 0.0–0.5)
Eosinophils Relative: 4 %
HCT: 42.1 % (ref 39.0–52.0)
Hemoglobin: 14.1 g/dL (ref 13.0–17.0)
Immature Granulocytes: 0 %
Lymphocytes Relative: 33 %
Lymphs Abs: 2.3 10*3/uL (ref 0.7–4.0)
MCH: 30.8 pg (ref 26.0–34.0)
MCHC: 33.5 g/dL (ref 30.0–36.0)
MCV: 91.9 fL (ref 80.0–100.0)
Monocytes Absolute: 0.4 10*3/uL (ref 0.1–1.0)
Monocytes Relative: 6 %
Neutro Abs: 4 10*3/uL (ref 1.7–7.7)
Neutrophils Relative %: 56 %
Platelets: 195 10*3/uL (ref 150–400)
RBC: 4.58 MIL/uL (ref 4.22–5.81)
RDW: 11.4 % — ABNORMAL LOW (ref 11.5–15.5)
WBC: 7 10*3/uL (ref 4.0–10.5)
nRBC: 0 % (ref 0.0–0.2)

## 2023-08-23 LAB — BASIC METABOLIC PANEL
Anion gap: 11 (ref 5–15)
BUN: 13 mg/dL (ref 6–20)
CO2: 24 mmol/L (ref 22–32)
Calcium: 9 mg/dL (ref 8.9–10.3)
Chloride: 105 mmol/L (ref 98–111)
Creatinine, Ser: 0.92 mg/dL (ref 0.61–1.24)
GFR, Estimated: >60 mL/min (ref >60)
Glucose, Bld: 175 mg/dL — ABNORMAL HIGH (ref 70–99)
Potassium: 3.4 mmol/L — ABNORMAL LOW (ref 3.5–5.1)
Sodium: 140 mmol/L (ref 135–145)

## 2023-08-23 NOTE — ED Provider Notes (Signed)
 [facility] Provider Note    Event Date/Time   First MD Initiated Contact with Patient 08/23/23 2130     (approximate)   History   Food Bolus   HPI  Scott Pearson is a 33 y.o. male who presents to the emergency department today because of concern for abnormal sensation in his throat.  The patient says that he choked on a pork rib this evening.  He was able to get the food out of his throat but continues to feel an odd sensation.  He has been able to swallow since then.  He denies any shortness of breath or difficulty breathing.     Physical Exam   Triage Vital Signs: ED Triage Vitals  Encounter Vitals Group     BP 08/23/23 2009 123/80     Systolic BP Percentile --      Diastolic BP Percentile --      Pulse Rate 08/23/23 2009 (!) 126     Resp 08/23/23 2009 18     Temp 08/23/23 2009 99 F (37.2 C)     Temp Source 08/23/23 2009 Oral     SpO2 08/23/23 2009 97 %     Weight 08/23/23 2010 150 lb (68 kg)     Height 08/23/23 2010 5' 10 (1.778 m)     Head Circumference --      Peak Flow --      Pain Score 08/23/23 2010 0     Pain Loc --      Pain Education --      Exclude from Growth Chart --     Most recent vital signs: Vitals:   08/23/23 2009 08/23/23 2122  BP: 123/80   Pulse: (!) 126   Resp: 18   Temp: 99 F (37.2 C)   SpO2: 97% 96%   General: Awake, alert, oriented. CV:  Good peripheral perfusion. Regular rate and rhythm. Resp:  Normal effort. Lungs clear. Abd:  No distention.   ED Results / Procedures / Treatments   Labs (all labs ordered are listed, but only abnormal results are displayed) Labs Reviewed  BASIC METABOLIC PANEL - Abnormal; Notable for the following components:      Result Value   Potassium 3.4 (*)    Glucose, Bld 175 (*)    All other components within normal limits  CBC WITH DIFFERENTIAL/PLATELET - Abnormal; Notable for the following components:   RDW 11.4 (*)    All other components within normal limits      EKG  None   RADIOLOGY I independently interpreted and visualized the soft tissue neck. My interpretation: No foreign body Radiology interpretation:  IMPRESSION:  Unremarkable soft tissue neck radiographs. No radiopaque foreign  body.      PROCEDURES:  Critical Care performed: No    MEDICATIONS ORDERED IN ED: Medications - No data to display   IMPRESSION / MDM / ASSESSMENT AND PLAN / ED COURSE  I reviewed the triage vital signs and the nursing notes.                              Differential diagnosis includes, but is not limited to, food impaction, globus  Patient's presentation is most consistent with acute presentation with potential threat to life or bodily function.   Patient presented to the emergency department today with concern for abnormal sensation to his throat after a choking episode.  Patient is maintaining his secretions.  Neck x-ray without any obvious foreign body.  At this time I have low concern for continued food impaction.  Did discuss globus with patient.  Will trial patient with drinking and if patient is able to drink without difficulty think would be reasonable for discharge.   Patient able to drink without difficulty.   FINAL CLINICAL IMPRESSION(S) / ED DIAGNOSES   Final diagnoses:  Globus sensation     Note:  This document was prepared using Dragon voice recognition software and may include unintentional dictation errors.    Floy Roberts, MD 08/23/23 2227

## 2023-08-23 NOTE — Discharge Instructions (Signed)
 Please seek medical attention for any high fevers, chest pain, shortness of breath, change in behavior, persistent vomiting, bloody stool or any other new or concerning symptoms.

## 2023-08-23 NOTE — ED Provider Triage Note (Signed)
 Emergency Medicine Provider Triage Evaluation Note  Scott Pearson , a 33 y.o. male  was evaluated in triage.  Pt complains of sore throat. Reports was eating pork chop and something got stuck, family performed heimlich and it dislodged the food, but he still feels something stuck. Able to swallow and handle secretions, vomiting once after heimlich. No chest pain.  Review of Systems  Positive: FB sensation Negative: fever  Physical Exam  BP 123/80 (BP Location: Right Arm)   Pulse (!) 126   Temp 99 F (37.2 C) (Oral)   Resp 18   Ht 5' 10 (1.778 m)   Wt 68 kg   SpO2 97%   BMI 21.52 kg/m  Gen:   Awake, no distress   Resp:  Normal effort  MSK:   Moves extremities without difficulty  Other:  Normal voice, handling secretions  Medical Decision Making  Medically screening exam initiated at 8:13 PM.  Appropriate orders placed.  Ej J Mowrey was informed that the remainder of the evaluation will be completed by another provider, this initial triage assessment does not replace that evaluation, and the importance of remaining in the ED until their evaluation is complete.     Javel Hersh E, PA-C 08/23/23 2013

## 2023-08-23 NOTE — ED Triage Notes (Signed)
 PER EMS: pt is from home with c/o choking on his food tonight ([employer] Pork chop) states it was lodged in his throat and a family member had to perform the Hemlich maneuver. Airway appears patent but he reports it feels like something is stuck in his throat. Pt able to speak in clear complete sentences. Throat does appear very red.  BP- 127/81, HR-126, 98% RA
